# Patient Record
Sex: Female | Born: 1955 | Race: White | Hispanic: No | Marital: Married
Health system: Southern US, Community
[De-identification: ages and names within clinical notes are randomized; demographics above are authoritative.]

---

## 2016-06-16 ENCOUNTER — Other Ambulatory Visit: Payer: Self-pay | Admitting: Obstetrics and Gynecology

## 2016-06-16 DIAGNOSIS — Z1231 Encounter for screening mammogram for malignant neoplasm of breast: Secondary | ICD-10-CM

## 2016-08-16 ENCOUNTER — Ambulatory Visit
Admission: RE | Admit: 2016-08-16 | Discharge: 2016-08-16 | Disposition: A | Discharge status: Home / Self Care | Payer: Managed Care, Other (non HMO) | Source: Ambulatory Visit | Attending: Obstetrics and Gynecology | Admitting: Obstetrics and Gynecology

## 2016-08-16 DIAGNOSIS — Z1231 Encounter for screening mammogram for malignant neoplasm of breast: Secondary | ICD-10-CM

## 2017-07-12 ENCOUNTER — Other Ambulatory Visit: Payer: Self-pay | Admitting: Obstetrics and Gynecology

## 2017-07-12 DIAGNOSIS — Z139 Encounter for screening, unspecified: Secondary | ICD-10-CM

## 2017-08-19 ENCOUNTER — Ambulatory Visit
Admission: RE | Admit: 2017-08-19 | Discharge: 2017-08-19 | Disposition: A | Discharge status: Home / Self Care | Payer: 59 | Source: Ambulatory Visit | Attending: Obstetrics and Gynecology | Admitting: Obstetrics and Gynecology

## 2017-08-19 DIAGNOSIS — Z139 Encounter for screening, unspecified: Secondary | ICD-10-CM

## 2018-10-24 ENCOUNTER — Other Ambulatory Visit: Payer: Self-pay | Admitting: Family Medicine

## 2018-10-24 ENCOUNTER — Other Ambulatory Visit: Payer: Self-pay | Admitting: Obstetrics and Gynecology

## 2018-10-24 DIAGNOSIS — Z1231 Encounter for screening mammogram for malignant neoplasm of breast: Secondary | ICD-10-CM

## 2018-11-01 ENCOUNTER — Other Ambulatory Visit: Payer: Self-pay | Admitting: Family Medicine

## 2018-11-01 DIAGNOSIS — E2839 Other primary ovarian failure: Secondary | ICD-10-CM

## 2018-11-01 DIAGNOSIS — M8580 Other specified disorders of bone density and structure, unspecified site: Secondary | ICD-10-CM

## 2018-11-23 ENCOUNTER — Ambulatory Visit: Payer: 59

## 2018-11-29 ENCOUNTER — Other Ambulatory Visit: Payer: Self-pay | Admitting: Physician Assistant

## 2018-11-29 MED ORDER — HYDROXYCHLOROQUINE SULFATE 200 MG PO TABS: 400.0000 mg | ORAL_TABLET | Freq: Two times a day (BID) | ORAL | 0 refills | Status: DC

## 2018-11-30 ENCOUNTER — Other Ambulatory Visit: Payer: 59

## 2018-12-05 ENCOUNTER — Other Ambulatory Visit: Payer: 59

## 2018-12-05 ENCOUNTER — Ambulatory Visit: Payer: 59

## 2018-12-13 ENCOUNTER — Other Ambulatory Visit: Payer: Self-pay | Admitting: Adult Health

## 2018-12-13 DIAGNOSIS — N390 Urinary tract infection, site not specified: Secondary | ICD-10-CM

## 2018-12-13 DIAGNOSIS — E785 Hyperlipidemia, unspecified: Secondary | ICD-10-CM | POA: Insufficient documentation

## 2018-12-13 DIAGNOSIS — K219 Gastro-esophageal reflux disease without esophagitis: Secondary | ICD-10-CM | POA: Insufficient documentation

## 2018-12-13 MED ORDER — CIPROFLOXACIN HCL 500 MG PO TABS: 500.0000 mg | ORAL_TABLET | Freq: Two times a day (BID) | ORAL | 0 refills | Status: AC

## 2018-12-13 MED ORDER — CIPROFLOXACIN HCL 500 MG PO TABS: 500.0000 mg | ORAL_TABLET | Freq: Two times a day (BID) | ORAL | 0 refills | Status: DC

## 2018-12-13 NOTE — Progress Notes (Signed)
Virtual Visit via Telephone Note  I connected with Sabrina Tate on 12/13/18 at  by telephone and verified that I am speaking with the correct person using two identifiers.   I discussed the limitations, risks, security and privacy concerns of performing an evaluation and management service by telephone and the availability of in person appointments. I also discussed with the patient that there may be a patient responsible charge related to this service. The patient expressed understanding and agreed to proceed.   History of Present Illness:  63 y.o. female with reported history of hyperlipidemia, GERD, hx of UTIs called to report 3 days of low grade fever (99.3), and new onset of dysuria, frequency but without urine character changes, flank pain, abdominal pain, nausea/vomiting. She denies vaginal discharge, or any URI sx.   She has had hx of headache with bactrim.    Observations/Objective:  General : Well sounding patient in no apparent distress HEENT: no hoarseness, no cough for duration of visit Lungs: speaks in complete sentences, no audible wheezing, no apparent distress Neurological: alert, oriented x 3 Psychiatric: pleasant, judgement appropriate    Assessment and Plan:  Diagnoses and all orders for this visit:  Urinary tract infection without hematuria, site unspecified Presumptive dx based on history Medications: ciprofloxacin. Maintain adequate hydration. Follow up if symptoms not improving, and as needed. -     ciprofloxacin (CIPRO) 500 MG tablet; Take 1 tablet (500 mg total) by mouth 2 (two) times daily for 5 days.   Follow Up Instructions:    I discussed the assessment and treatment plan with the patient. The patient was provided an opportunity to ask questions and all were answered. The patient agreed with the plan and demonstrated an understanding of the instructions.   The patient was advised to call back or seek an in-person evaluation if the symptoms worsen or  if the condition fails to improve as anticipated.  I provided 15 minutes of non-face-to-face time during this encounter.   Dan Maker, NP

## 2018-12-22 ENCOUNTER — Other Ambulatory Visit: Payer: Self-pay | Admitting: Physician Assistant

## 2018-12-22 MED ORDER — AMOXICILLIN-POT CLAVULANATE 875-125 MG PO TABS: 1.0000 | ORAL_TABLET | Freq: Two times a day (BID) | ORAL | 0 refills | Status: AC

## 2018-12-22 MED ORDER — FLUCONAZOLE 150 MG PO TABS: 150.0000 mg | ORAL_TABLET | Freq: Every day | ORAL | 3 refills | Status: AC

## 2019-02-06 ENCOUNTER — Other Ambulatory Visit: Payer: 59

## 2019-02-06 ENCOUNTER — Ambulatory Visit: Payer: 59

## 2019-03-28 ENCOUNTER — Ambulatory Visit
Admission: RE | Admit: 2019-03-28 | Discharge: 2019-03-28 | Disposition: A | Discharge status: Home / Self Care | Source: Ambulatory Visit | Attending: Family Medicine | Admitting: Family Medicine

## 2019-03-28 DIAGNOSIS — Z1231 Encounter for screening mammogram for malignant neoplasm of breast: Secondary | ICD-10-CM

## 2019-03-28 DIAGNOSIS — E2839 Other primary ovarian failure: Secondary | ICD-10-CM

## 2019-03-28 DIAGNOSIS — M8580 Other specified disorders of bone density and structure, unspecified site: Secondary | ICD-10-CM

## 2019-03-28 IMAGING — MG DIGITAL SCREENING BILATERAL MAMMOGRAM WITH IMPLANTS, CAD AND TOM
8 of 12 series · 8 of 28 positions shown · non-contrast
Comparison: Previous exam(s).

CLINICAL DATA: Screening.

EXAM:
DIGITAL SCREENING BILATERAL MAMMOGRAM WITH IMPLANTS, CAD AND TOMO
The patient has retropectoral implants. Standard and implant
displaced views were performed.

[L CC]
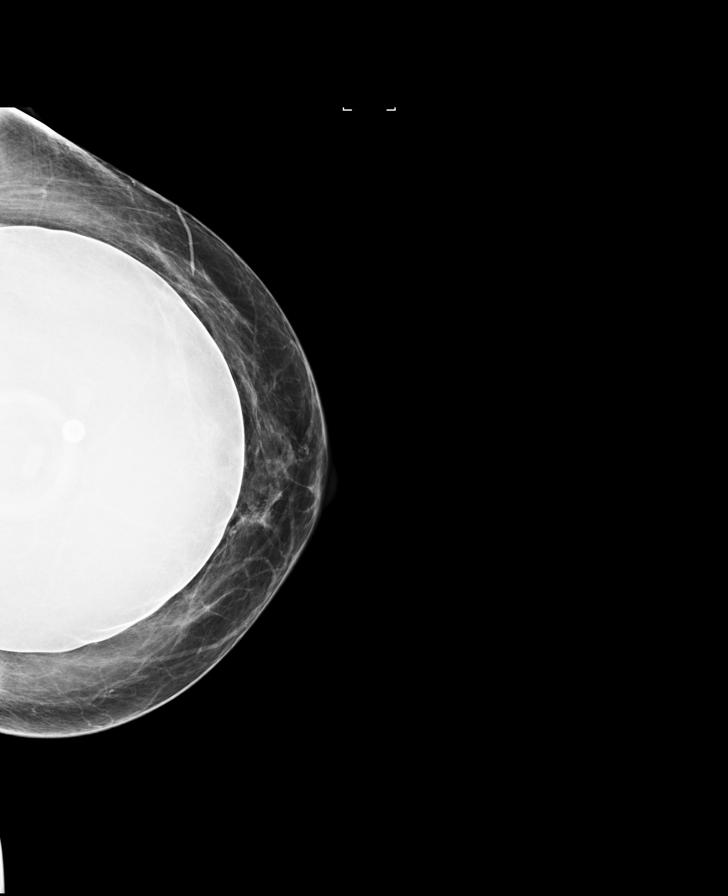

[L MLO]
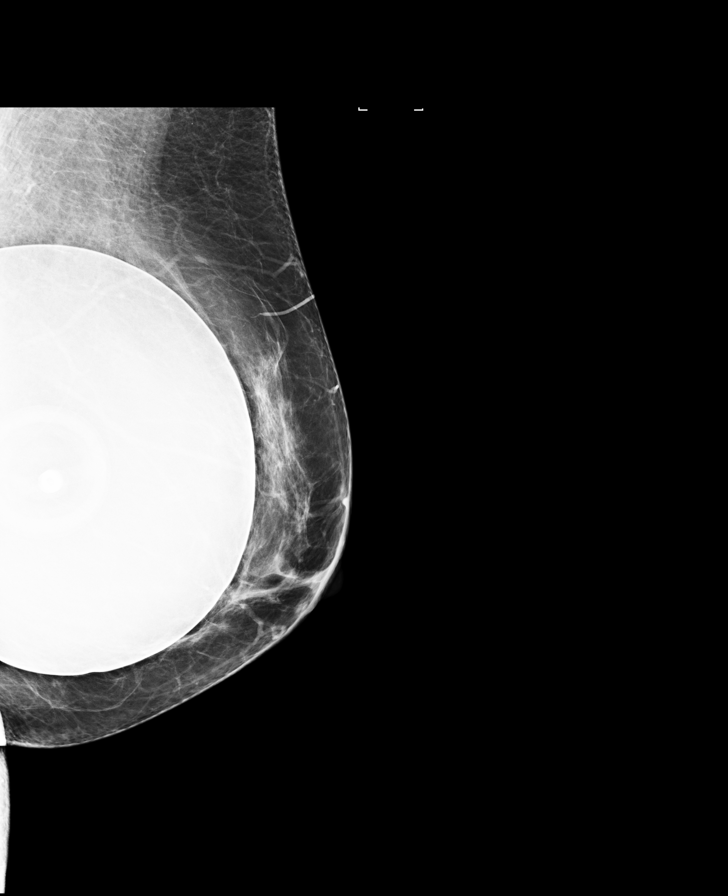

[R CC]
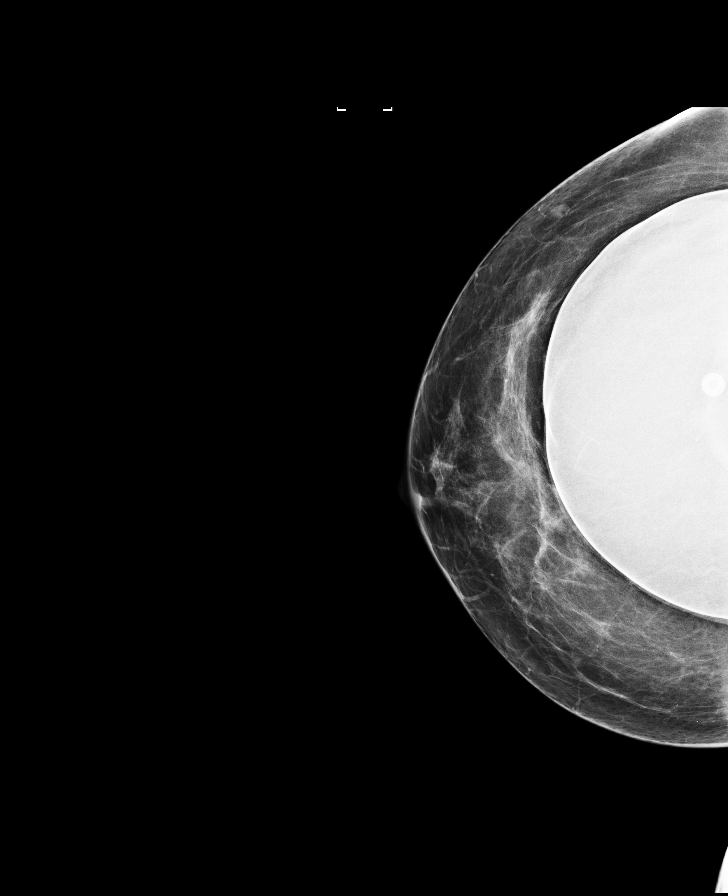

[R MLO]
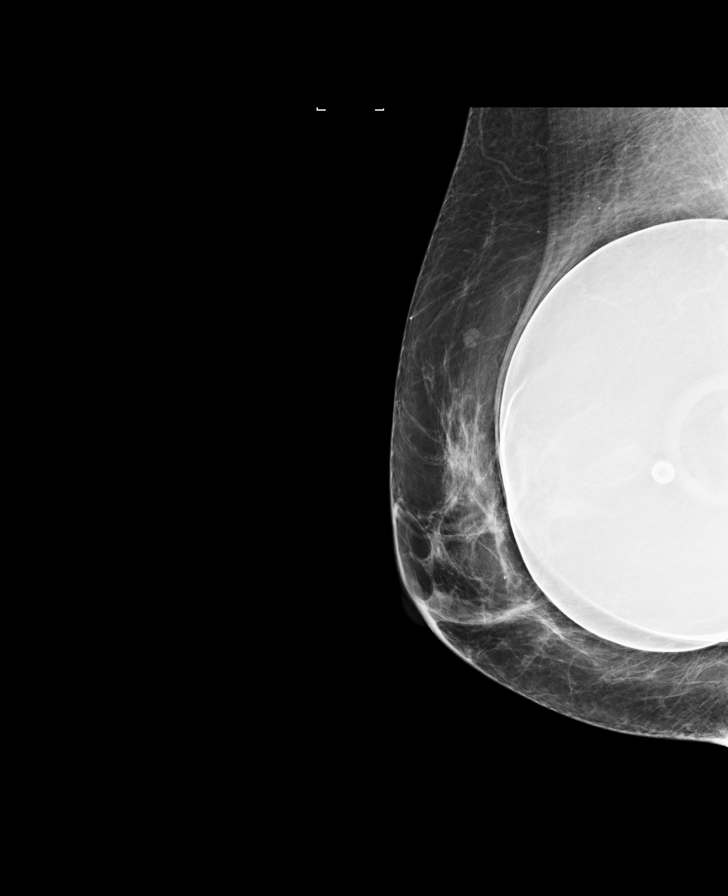

[L CC synth-2D]
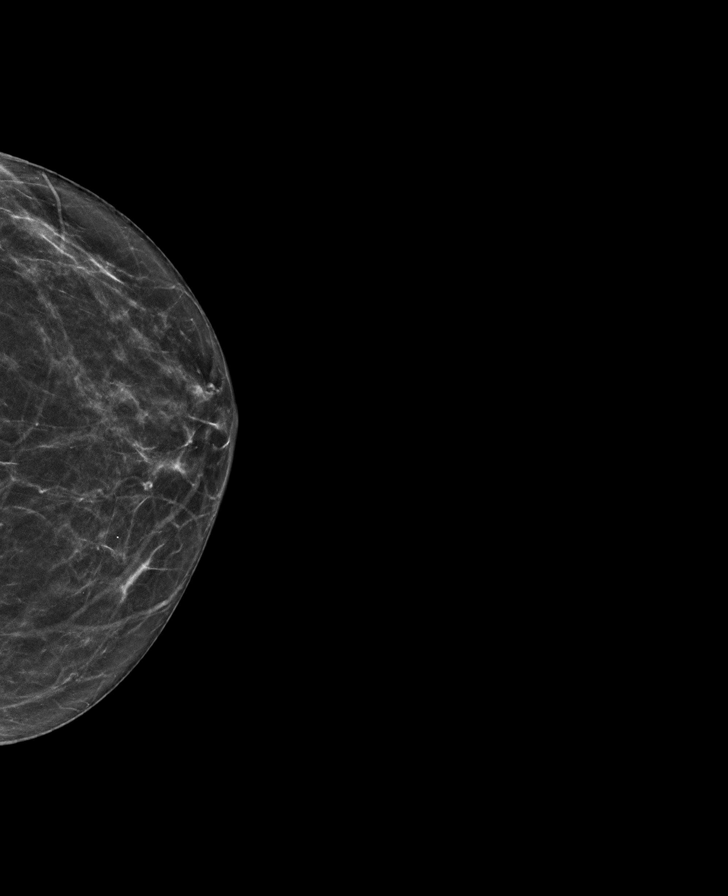

[R MLO synth-2D]
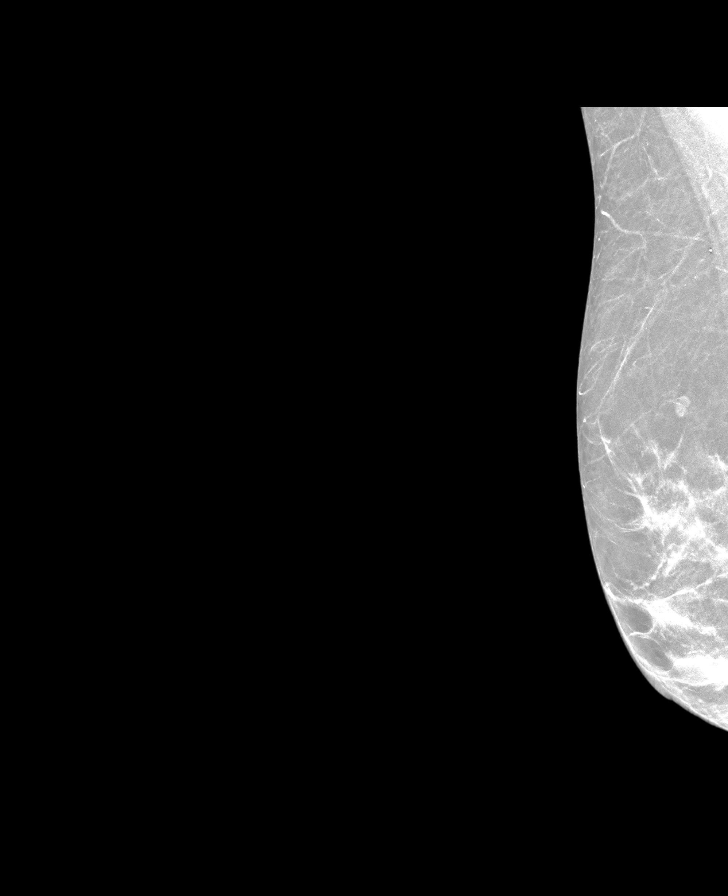

[L MLO synth-2D]
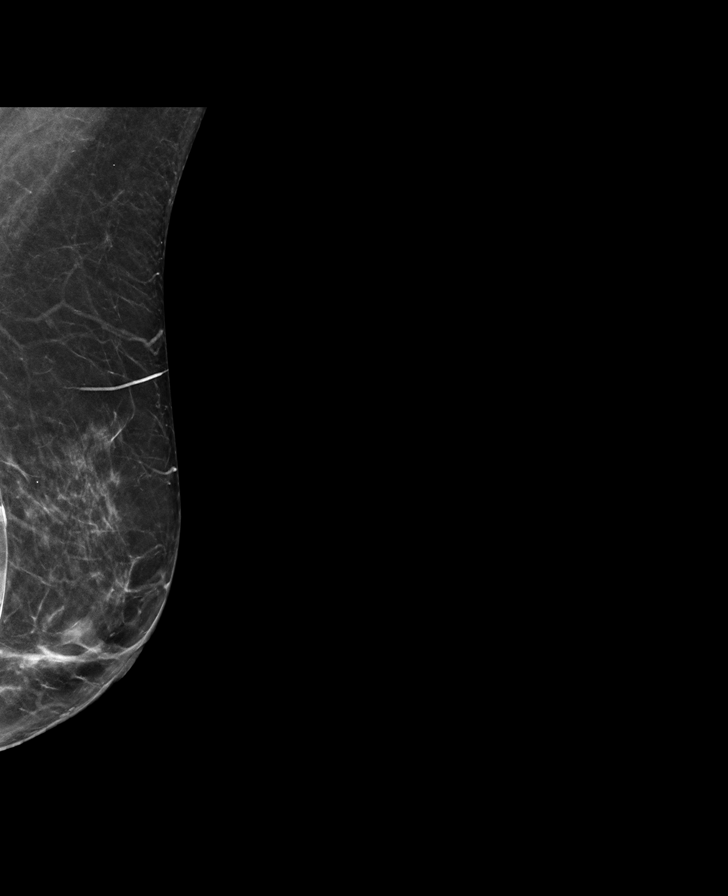

[R CC synth-2D]
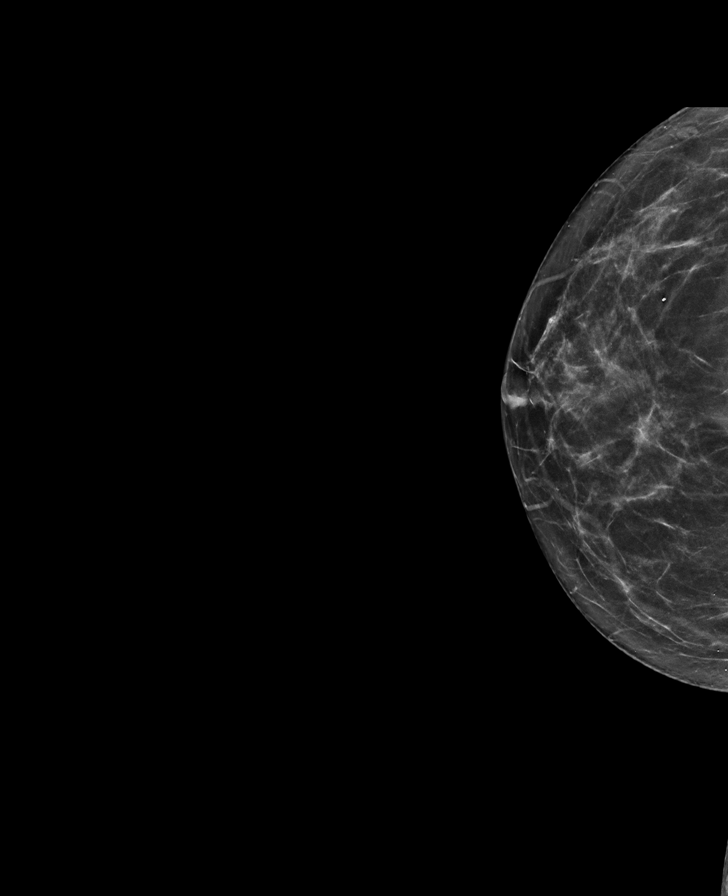

[8 of 28 positions shown; findings below may reference images not displayed]

ACR Breast Density Category b: There are scattered areas of
fibroglandular density.
FINDINGS: There are no findings suspicious for malignancy. Images were
processed with CAD.
IMPRESSION: No mammographic evidence of malignancy. A result letter of this
screening mammogram will be mailed directly to the patient.

RECOMMENDATION:
Screening mammogram in one year. (Code:[6A])

BI-RADS CATEGORY  1:  Negative.

## 2019-12-24 ENCOUNTER — Other Ambulatory Visit: Payer: Self-pay

## 2019-12-24 ENCOUNTER — Ambulatory Visit: Attending: Internal Medicine

## 2019-12-24 DIAGNOSIS — Z23 Encounter for immunization: Secondary | ICD-10-CM

## 2019-12-24 NOTE — Progress Notes (Signed)
   Covid-19 Vaccination Clinic  Name:  Maeryn Mcgath    MRN: [id number] DOB: October 14, 1955  12/24/2019  Ms. Rhue was observed post Covid-19 immunization for 15 minutes without incident. She was provided with Vaccine Information Sheet and instruction to access the V-Safe system.   Ms. Mackintosh was instructed to call 911 with any severe reactions post vaccine: Marland Kitchen Difficulty breathing  . Swelling of face and throat  . A fast heartbeat  . A bad rash all over body  . Dizziness and weakness   Immunizations Administered    Name Date Dose VIS Date Route   Pfizer COVID-19 Vaccine 12/24/2019 10:06 AM 0.3 mL 08/24/2019 Intramuscular   Manufacturer: [employer], [employer]   Lot: [id number]   NDC: [address]-8

## 2020-01-15 ENCOUNTER — Ambulatory Visit

## 2020-01-25 ENCOUNTER — Ambulatory Visit: Attending: Internal Medicine

## 2020-01-25 ENCOUNTER — Other Ambulatory Visit: Payer: Self-pay

## 2020-01-25 DIAGNOSIS — Z23 Encounter for immunization: Secondary | ICD-10-CM

## 2020-01-25 NOTE — Progress Notes (Signed)
   Covid-19 Vaccination Clinic  Name:  Sabrina Tate    MRN: [id number] DOB: October 09, 1955  01/25/2020  [name] was observed post Covid-19 immunization for 15 minutes without incident. She was provided with Vaccine Information Sheet and instruction to access the V-Safe system.   [name] was instructed to call 911 with any severe reactions post vaccine: Marland Kitchen Difficulty breathing  . Swelling of face and throat  . A fast heartbeat  . A bad rash all over body  . Dizziness and weakness   Immunizations Administered    Name Date Dose VIS Date Route   Pfizer COVID-19 Vaccine 01/25/2020 10:54 AM 0.3 mL 11/07/2018 Intramuscular   Manufacturer: [employer], [employer]   Lot: [id number]   NDC: [address]-4

## 2020-06-25 ENCOUNTER — Other Ambulatory Visit: Payer: Self-pay | Admitting: Family Medicine

## 2020-06-25 DIAGNOSIS — Z1231 Encounter for screening mammogram for malignant neoplasm of breast: Secondary | ICD-10-CM

## 2020-07-22 ENCOUNTER — Ambulatory Visit

## 2021-03-04 ENCOUNTER — Encounter: Payer: Self-pay | Admitting: *Deleted

## 2021-03-04 ENCOUNTER — Emergency Department: Payer: Medicare HMO

## 2021-03-04 ENCOUNTER — Other Ambulatory Visit: Payer: Self-pay

## 2021-03-04 ENCOUNTER — Emergency Department
Admission: EM | Admit: 2021-03-04 | Discharge: 2021-03-04 | Disposition: A | Discharge status: Home / Self Care | Payer: Medicare HMO | Attending: Emergency Medicine | Admitting: Emergency Medicine

## 2021-03-04 DIAGNOSIS — S0083XA Contusion of other part of head, initial encounter: Secondary | ICD-10-CM | POA: Diagnosis not present

## 2021-03-04 DIAGNOSIS — W01198A Fall on same level from slipping, tripping and stumbling with subsequent striking against other object, initial encounter: Secondary | ICD-10-CM | POA: Insufficient documentation

## 2021-03-04 DIAGNOSIS — S0012XA Contusion of left eyelid and periocular area, initial encounter: Secondary | ICD-10-CM | POA: Diagnosis not present

## 2021-03-04 DIAGNOSIS — S0990XA Unspecified injury of head, initial encounter: Secondary | ICD-10-CM | POA: Diagnosis present

## 2021-03-04 DIAGNOSIS — W19XXXA Unspecified fall, initial encounter: Secondary | ICD-10-CM

## 2021-03-04 DIAGNOSIS — S060X0A Concussion without loss of consciousness, initial encounter: Secondary | ICD-10-CM | POA: Diagnosis not present

## 2021-03-04 DIAGNOSIS — S01512A Laceration without foreign body of oral cavity, initial encounter: Secondary | ICD-10-CM

## 2021-03-04 NOTE — ED Triage Notes (Signed)
Pt states she got out of her car and fell. Unsure of why she fell, denied dizziness or symptoms prior to the fall. Has had 1/2 bottle of wine tonight. No blood thinners, denies LOC. Denies neck or back pain.  She has hematoma to the left forehead, left face, abrasions to the right knee, avulsion to the lower lip area at the gums.

## 2021-03-04 NOTE — ED Provider Notes (Signed)
[facility] Emergency Department Provider Note   ____________________________________________   Event Date/Time   First MD Initiated Contact with Patient 03/04/21 2128     (approximate)  I have reviewed the triage vital signs and the nursing notes.   HISTORY  Chief Complaint Fall    HPI Sabrina Tate is a 65 y.o. female with below stated past medical history who presents after she fell getting out of her car just prior to arrival.  Patient states that she has been drinking tonight and lost her footing resulting in falling forward striking her face on the cement of the driveway.  Patient denies any loss of consciousness.  Patient endorses abrasions, contusions, and an intraoral laceration.  Patient denies any subsequent loss of consciousness, nausea/vomiting, blurry visions, double vision, or weakness/numbness/paresthesias in any extremity         History reviewed. No pertinent past medical history.  Patient Active Problem List   Diagnosis Date Noted   Hyperlipidemia 12/13/2018   GERD (gastroesophageal reflux disease) 12/13/2018    History reviewed. No pertinent surgical history.  Prior to Admission medications   Medication Sig Start Date End Date Taking? Authorizing Provider  amoxicillin-clavulanate (AUGMENTIN) 875-125 MG tablet Take 1 tablet by mouth 2 (two) times daily. 12/22/18   Doree Albee, PA-C  fluconazole (DIFLUCAN) 150 MG tablet Take 1 tablet (150 mg total) by mouth daily. 12/22/18   Doree Albee, PA-C    Allergies Vicodin [hydrocodone-acetaminophen]  No family history on file.  Social History    Review of Systems Constitutional: No fever/chills Eyes: No visual changes. ENT: No sore throat.  Endorses intraoral laceration Cardiovascular: Denies chest pain. Respiratory: Denies shortness of breath. Gastrointestinal: No abdominal pain.  No nausea, no vomiting.  No diarrhea. Genitourinary: Negative for  dysuria. Musculoskeletal: Negative for acute arthralgias Skin: Negative for rash.  Abrasions to left aspect of face Neurological: Endorses frontal headache underlying contusion, denies weakness/numbness/paresthesias in any extremity Psychiatric: Negative for suicidal ideation/homicidal ideation   ____________________________________________   PHYSICAL EXAM:  VITAL SIGNS: ED Triage Vitals  Enc Vitals Group     BP 03/04/21 2037 (!) 188/83     Pulse Rate 03/04/21 2037 77     Resp 03/04/21 2037 16     Temp 03/04/21 2037 98.4 F (36.9 C)     Temp src --      SpO2 03/04/21 2037 100 %     Weight 03/04/21 2116 145 lb (65.8 kg)     Height 03/04/21 2116 5\' 8"  (1.727 m)     Head Circumference --      Peak Flow --      Pain Score 03/04/21 2036 4     Pain Loc --      Pain Edu? --      Excl. in GC? --    Constitutional: Alert and oriented. Well appearing and in no acute distress. Eyes: Conjunctivae are normal. PERRL. Head: Left forehead contusion with associated abrasion, abrasions over left side of face Nose: No congestion/rhinnorhea. Mouth/Throat: Avulsion injury with laceration of the bottom gum from the mandibular gumline.  Mucous membranes are moist. Neck: No stridor Cardiovascular: Grossly normal heart sounds.  Good peripheral circulation. Respiratory: Normal respiratory effort.  No retractions. Gastrointestinal: Soft and nontender. No distention. Musculoskeletal: No obvious deformities Neurologic:  Normal speech and language. No gross focal neurologic deficits are appreciated. Skin:  Skin is warm and dry.  Abrasions over forehead and left side of the face Psychiatric: Mood and affect are  normal. Speech and behavior are normal.  ____________________________________________   LABS (all labs ordered are listed, but only abnormal results are displayed)  Labs Reviewed - No data to display  RADIOLOGY  ED MD interpretation: CT of the head without contrast shows a left frontal  scalp hematoma without any acute intracranial abnormalities  Official radiology report(s): CT Head Wo Contrast  Result Date: 03/04/2021 CLINICAL DATA:  Status post fall EXAM: CT HEAD WITHOUT CONTRAST TECHNIQUE: Contiguous axial images were obtained from the base of the skull through the vertex without intravenous contrast. COMPARISON:  None. FINDINGS: Brain: No evidence of large-territorial acute infarction. No parenchymal hemorrhage. No mass lesion. No extra-axial collection. No mass effect or midline shift. No hydrocephalus. Basilar cisterns are patent. Vascular: No hyperdense vessel. Skull: No acute fracture or focal lesion. Sinuses/Orbits: Paranasal sinuses and mastoid air cells are clear. The orbits are unremarkable. Other: Left frontal scalp 8 mm hematoma. IMPRESSION: No acute intracranial abnormality. Electronically Signed   By: Tish Frederickson M.D.   On: 03/04/2021 22:07    ____________________________________________   PROCEDURES  Procedure(s) performed (including Critical Care):  .1-3 Lead EKG Interpretation  Date/Time: 03/04/2021 10:43 PM Performed by: Merwyn Katos, MD Authorized by: Merwyn Katos, MD     Interpretation: normal     ECG rate:  74   ECG rate assessment: normal     Rhythm: sinus rhythm     Ectopy: none     Conduction: normal     ____________________________________________   INITIAL IMPRESSION / ASSESSMENT AND PLAN / ED COURSE  As part of my medical decision making, I reviewed the following data within the electronic MEDICAL RECORD NUMBER Nursing notes reviewed and incorporated, Labs reviewed, EKG interpreted, Old chart reviewed, Radiograph reviewed and Notes from prior ED visits reviewed and incorporated        Patient presenting with head trauma.  Patient's neurological exam was non-focal and unremarkable.  [place] Head CT Rule was applied and patient did not fall into the low risk category so a head CT was obtained.  This showed no significant  findings.  At this time, it is felt that the most likely explanation for the patient's symptoms is concussion.   I also considered SAH, SDH, Epidural Hematoma, IPH, skull fracture, migraine but this appears less likely considering the data gathered thus far.   Patient provided concussion protocol.   Patient remained stable and neurologically intact while in the emergency department.  Discussed warning signs that would prompt return to ED.  Head trauma handout was provided.  Discussed in detail concussion management.  No sports or strenuous activity until symptoms free.  Return to emergency department urgently if new or worsening symptoms develop.    Impression:  Concussion Intraoral laceration Forehead contusion Facial abrasions  Plan  Discharge from ED Tylenol for pain control. Avoid aspirin, NSAIDs, or other blood thinners. Advised patient on supportive measures for cognitive rest - avoid use of cognitive function for at least? hours.  This means no tv, books, texting, computers, etc. Limit visitors to the house.  Head trauma instructions provided in discharge instructions Instructed Pt to monitor for neurologic symptoms, severe HA, change in mental status, seizures, loss of conciousness. Instructed Pt to f/up w/ PCP in 3 days or ETC should symptoms worsen or not improve. Pt verbally expressed understanding and all questions were addressed to Pt's satisfaction.      ____________________________________________   FINAL CLINICAL IMPRESSION(S) / ED DIAGNOSES  Final diagnoses:  Fall, initial encounter  Contusion of face, initial encounter  Contusion of left periocular region, initial encounter  Laceration of oral cavity without foreign body, initial encounter  Concussion without loss of consciousness, initial encounter     ED Discharge Orders     None        Note:  This document was prepared using Dragon voice recognition software and may include unintentional dictation  errors.    Merwyn Katos, MD 03/04/21 [id number]

## 2021-03-12 ENCOUNTER — Ambulatory Visit
Admission: RE | Admit: 2021-03-12 | Discharge: 2021-03-12 | Disposition: A | Discharge status: Home / Self Care | Payer: Medicare HMO | Source: Ambulatory Visit | Attending: Home Modifications | Admitting: Home Modifications

## 2021-03-12 ENCOUNTER — Other Ambulatory Visit: Payer: Self-pay | Admitting: Home Modifications

## 2021-03-12 DIAGNOSIS — S0993XA Unspecified injury of face, initial encounter: Secondary | ICD-10-CM

## 2021-04-10 ENCOUNTER — Other Ambulatory Visit: Payer: Self-pay | Admitting: Family Medicine

## 2021-04-10 DIAGNOSIS — Z1231 Encounter for screening mammogram for malignant neoplasm of breast: Secondary | ICD-10-CM

## 2021-10-05 ENCOUNTER — Ambulatory Visit
Admission: RE | Admit: 2021-10-05 | Discharge: 2021-10-05 | Disposition: A | Discharge status: Home / Self Care | Payer: Medicare HMO | Source: Ambulatory Visit | Attending: Family Medicine | Admitting: Family Medicine

## 2021-10-05 ENCOUNTER — Other Ambulatory Visit: Payer: Self-pay

## 2021-10-05 ENCOUNTER — Other Ambulatory Visit: Payer: Self-pay | Admitting: Family Medicine

## 2021-10-05 DIAGNOSIS — R509 Fever, unspecified: Secondary | ICD-10-CM

## 2021-10-05 DIAGNOSIS — R1031 Right lower quadrant pain: Secondary | ICD-10-CM

## 2021-10-05 DIAGNOSIS — R1032 Left lower quadrant pain: Secondary | ICD-10-CM

## 2021-10-05 DIAGNOSIS — R110 Nausea: Secondary | ICD-10-CM | POA: Diagnosis present

## 2021-10-05 LAB — POCT I-STAT CREATININE: Creatinine, Ser: 0.7 mg/dL (ref 0.44–1.00)

## 2021-10-05 MED ORDER — IOHEXOL 300 MG/ML  SOLN
100.0000 mL | Freq: Once | INTRAMUSCULAR | Status: AC | PRN
  Administered 2021-10-05: 100 mL via INTRAVENOUS
